# Patient Record
Sex: Female | Born: 1955 | Race: Black or African American | Hispanic: No
Health system: Southern US, Community
[De-identification: ages and names within clinical notes are randomized; demographics above are authoritative.]

---

## 2009-02-17 ENCOUNTER — Emergency Department (HOSPITAL_COMMUNITY): Admission: EM | Admit: 2009-02-17 | Discharge: 2009-02-18 | Payer: Self-pay | Admitting: Emergency Medicine

## 2010-09-26 IMAGING — CR DG CHEST 2V
2 series · 2 of 2 positions shown · non-contrast
Comparison: None

CLINICAL DATA: Mid chest pain, shortness of breath, nausea and
vomiting

CHEST - 2 VIEW

[w chest pa]
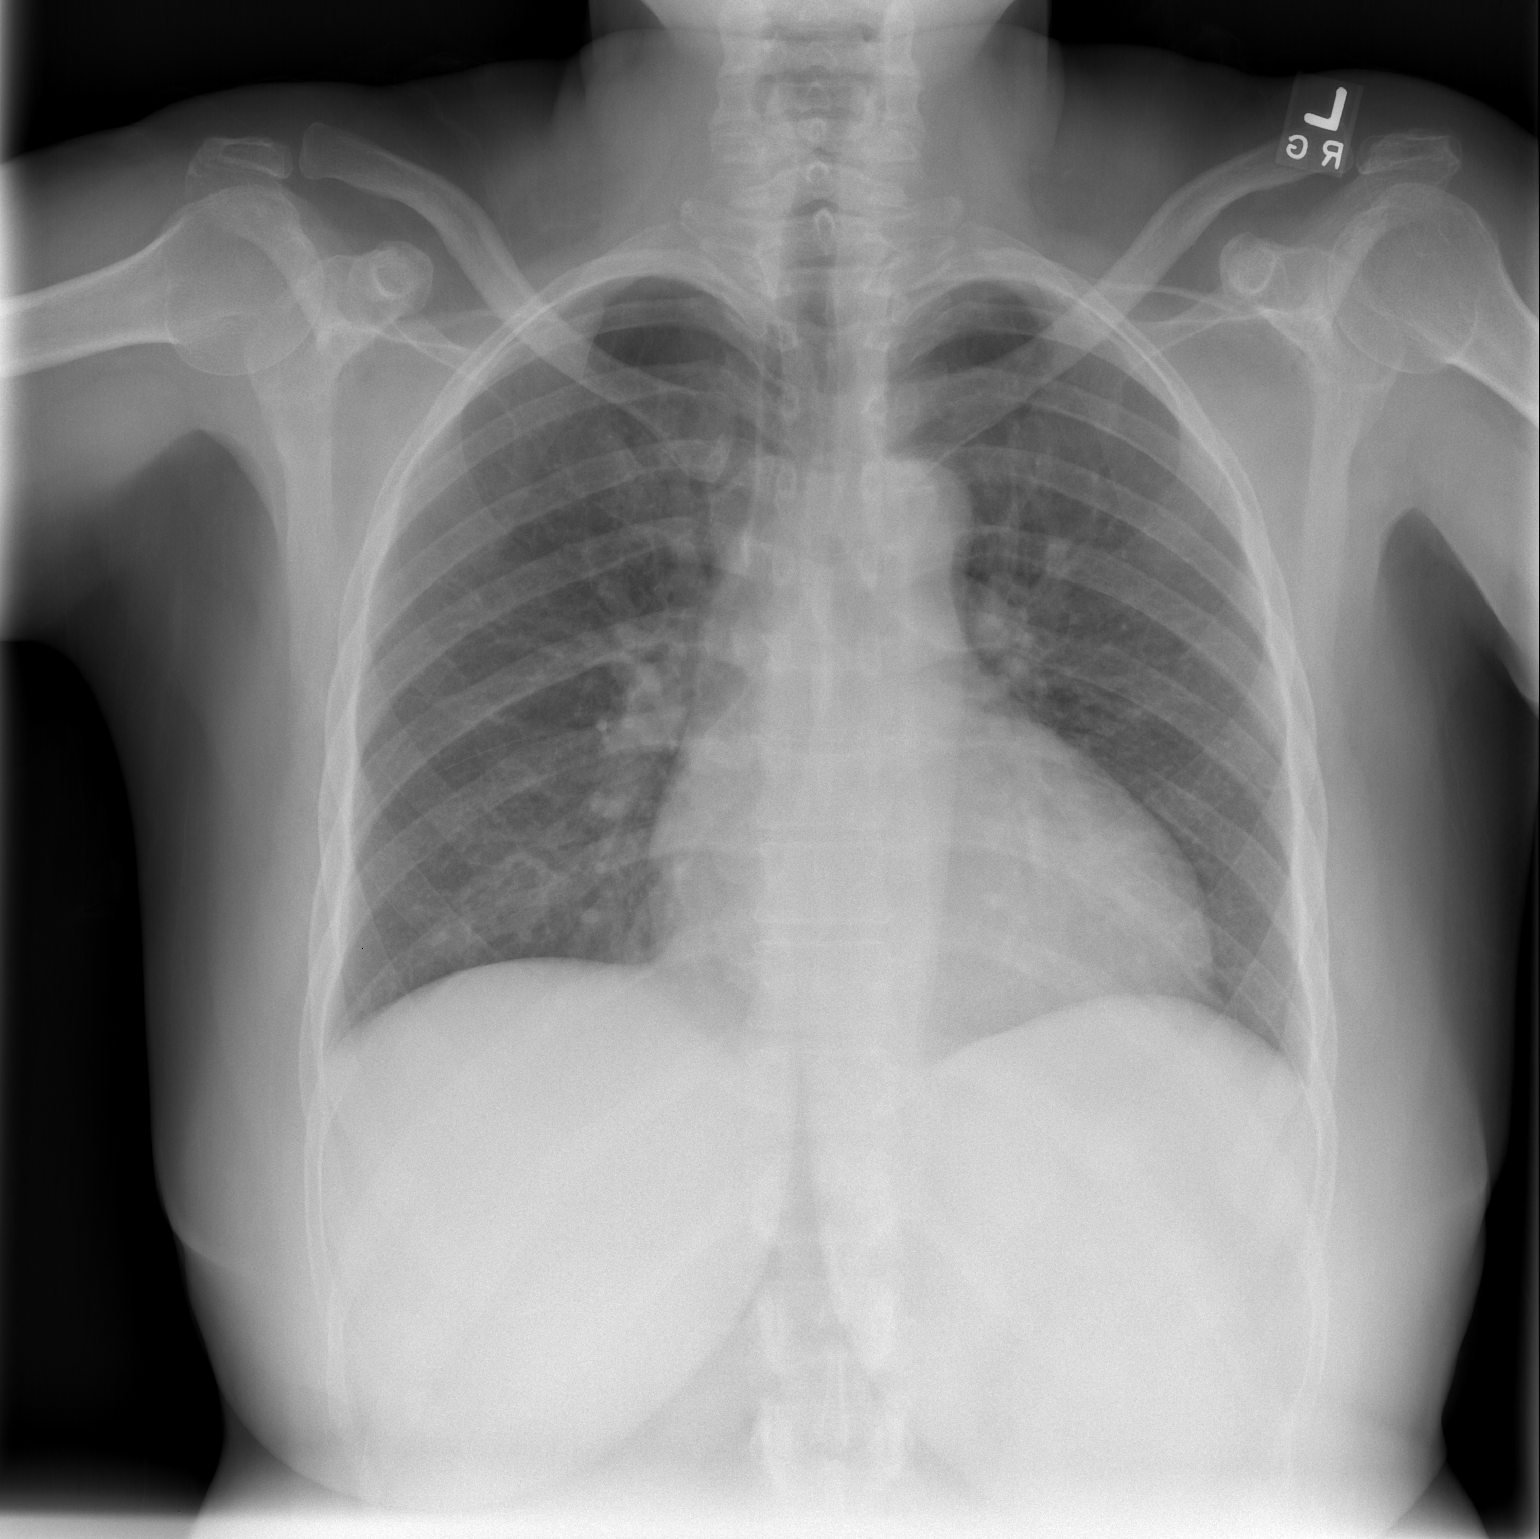

[w chest lat]
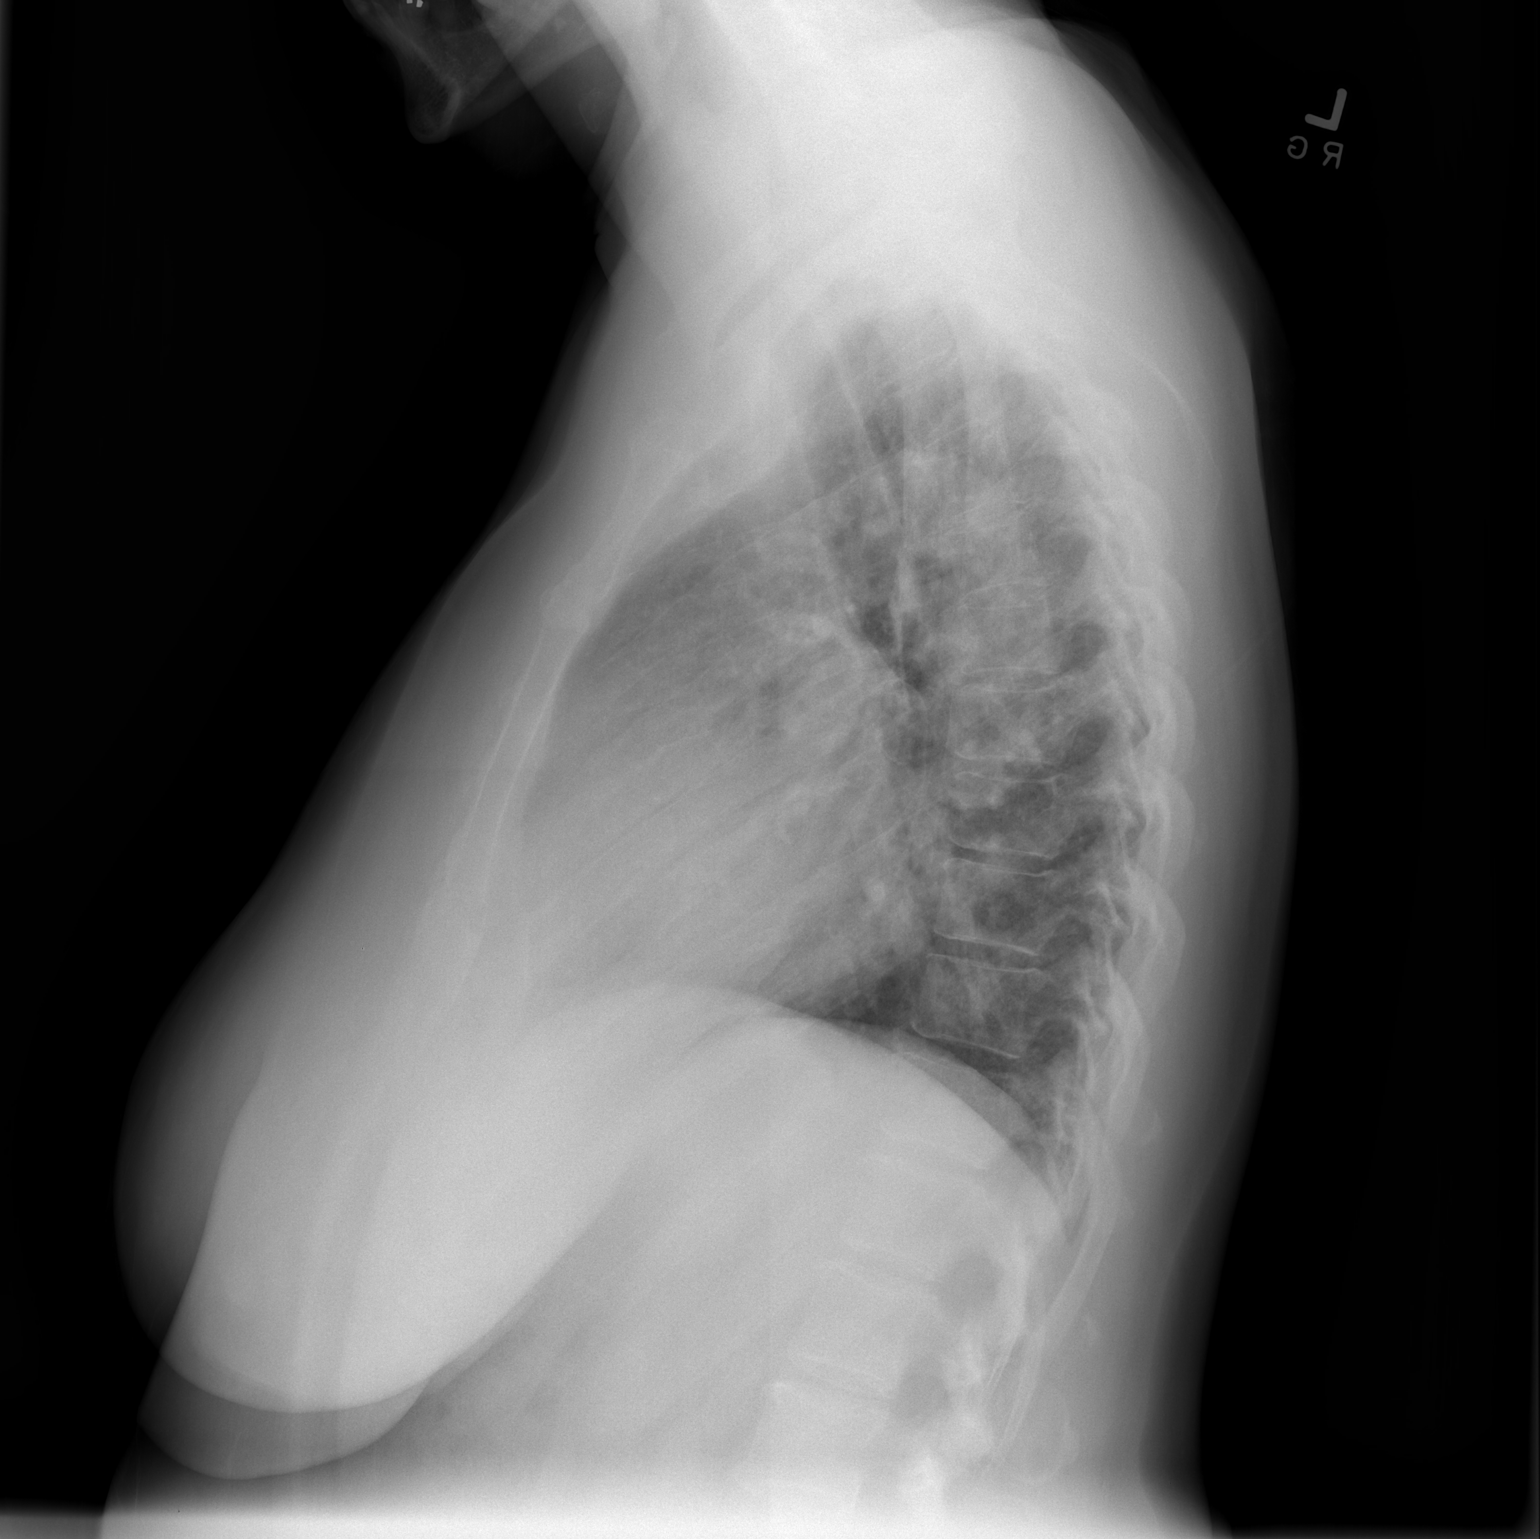

[2 of 2 positions shown; findings below may reference images not displayed]

FINDINGS: The lungs are clear. Slightly prominent perihilar
markings are noted. The heart is within normal limits in size. No
bony abnormality is seen.
IMPRESSION: No active lung disease.

## 2011-04-16 LAB — DIFFERENTIAL
Basophils Absolute: 0 10*3/uL (ref 0.0–0.1)
Eosinophils Absolute: 0 10*3/uL (ref 0.0–0.7)
Lymphs Abs: 1.2 10*3/uL (ref 0.7–4.0)
Neutrophils Relative %: 86 % — ABNORMAL HIGH (ref 43–77)

## 2011-04-16 LAB — CK TOTAL AND CKMB (NOT AT ARMC)
CK, MB: 0.8 ng/mL (ref 0.3–4.0)
Relative Index: INVALID (ref 0.0–2.5)
Relative Index: INVALID (ref 0.0–2.5)
Total CK: 54 U/L (ref 7–177)

## 2011-04-16 LAB — URINE CULTURE: Colony Count: 15000

## 2011-04-16 LAB — POCT I-STAT, CHEM 8
Calcium, Ion: 1.15 mmol/L (ref 1.12–1.32)
Creatinine, Ser: 0.4 mg/dL (ref 0.4–1.2)
Glucose, Bld: 304 mg/dL — ABNORMAL HIGH (ref 70–99)
HCT: 33 % — ABNORMAL LOW (ref 36.0–46.0)
Hemoglobin: 11.2 g/dL — ABNORMAL LOW (ref 12.0–15.0)
Potassium: 3.8 mEq/L (ref 3.5–5.1)
TCO2: 21 mmol/L (ref 0–100)

## 2011-04-16 LAB — URINALYSIS, ROUTINE W REFLEX MICROSCOPIC
Bilirubin Urine: NEGATIVE
Ketones, ur: >80 mg/dL — AB
Nitrite: NEGATIVE
Protein, ur: NEGATIVE mg/dL
Urobilinogen, UA: 1 mg/dL (ref 0.0–1.0)

## 2011-04-16 LAB — POCT CARDIAC MARKERS
CKMB, poc: <1 ng/mL — ABNORMAL LOW (ref 1.0–8.0)
CKMB, poc: <1 ng/mL — ABNORMAL LOW (ref 1.0–8.0)
Myoglobin, poc: 44.3 ng/mL (ref 12–200)
Troponin i, poc: <0.05 ng/mL (ref 0.00–0.09)

## 2011-04-16 LAB — COMPREHENSIVE METABOLIC PANEL
ALT: 14 U/L (ref 0–35)
CO2: 19 mEq/L (ref 19–32)
Calcium: 9.5 mg/dL (ref 8.4–10.5)
Chloride: 106 mEq/L (ref 96–112)
Creatinine, Ser: 0.57 mg/dL (ref 0.4–1.2)
GFR calc non Af Amer: >60 mL/min (ref >60)
Glucose, Bld: 305 mg/dL — ABNORMAL HIGH (ref 70–99)
Total Bilirubin: 1.4 mg/dL — ABNORMAL HIGH (ref 0.3–1.2)

## 2011-04-16 LAB — CBC
MCHC: 33.4 g/dL (ref 30.0–36.0)
MCV: 88.5 fL (ref 78.0–100.0)
Platelets: 233 10*3/uL (ref 150–400)

## 2011-04-16 LAB — LIPASE, BLOOD: Lipase: 15 U/L (ref 11–59)

## 2011-04-16 LAB — TROPONIN I: Troponin I: <0.01 ng/mL (ref 0.00–0.06)

## 2020-10-26 ENCOUNTER — Ambulatory Visit: Payer: Self-pay | Attending: Internal Medicine

## 2020-10-26 DIAGNOSIS — Z23 Encounter for immunization: Secondary | ICD-10-CM

## 2020-10-26 NOTE — Progress Notes (Signed)
   [facility]  Name:  Peggy Woods    MRN: [id number] DOB: 1956/10/25  10/26/2020  [name] was observed post Covid-19 immunization for 15 minutes without incident. She was provided with Vaccine Information Sheet and instruction to access the V-Safe system.   [name] was instructed to call 911 with any severe reactions post vaccine: Marland Kitchen Difficulty breathing  . Swelling of face and throat  . A fast heartbeat  . A bad rash all over body  . Dizziness and weakness

## 2020-11-03 ENCOUNTER — Encounter (HOSPITAL_BASED_OUTPATIENT_CLINIC_OR_DEPARTMENT_OTHER): Payer: Self-pay | Admitting: Internal Medicine
